# Patient Record
Sex: Male | Born: 1995 | Race: Black or African American | Hispanic: No | Marital: Single
Health system: Midwestern US, Community
[De-identification: ages and names within clinical notes are randomized; demographics above are authoritative.]

## PROBLEM LIST (undated history)

## (undated) DIAGNOSIS — K439 Ventral hernia without obstruction or gangrene: Secondary | ICD-10-CM

## (undated) DIAGNOSIS — J45909 Unspecified asthma, uncomplicated: Secondary | ICD-10-CM

---
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
## 2016-09-20 ENCOUNTER — Ambulatory Visit (HOSPITAL_COMMUNITY)
Admission: EM | Admit: 2016-09-20 | Discharge: 2016-09-20 | Disposition: A | Discharge status: Home / Self Care | Payer: Managed Care, Other (non HMO) | Attending: Emergency Medicine | Admitting: Emergency Medicine

## 2016-09-20 ENCOUNTER — Encounter (HOSPITAL_COMMUNITY): Payer: Self-pay | Admitting: Emergency Medicine

## 2016-09-20 DIAGNOSIS — IMO0001 Reserved for inherently not codable concepts without codable children: Secondary | ICD-10-CM

## 2016-09-20 DIAGNOSIS — K219 Gastro-esophageal reflux disease without esophagitis: Secondary | ICD-10-CM

## 2016-09-20 DIAGNOSIS — R1110 Vomiting, unspecified: Secondary | ICD-10-CM

## 2016-09-20 MED ORDER — RANITIDINE HCL 150 MG PO CAPS: ORAL_CAPSULE | ORAL | 0 refills | Status: DC

## 2016-09-20 MED ORDER — OMEPRAZOLE 20 MG PO CPDR: 20.0000 mg | DELAYED_RELEASE_CAPSULE | Freq: Every day | ORAL | 0 refills | Status: DC

## 2016-09-20 MED ORDER — METOCLOPRAMIDE HCL 10 MG PO TABS: 10.0000 mg | ORAL_TABLET | Freq: Three times a day (TID) | ORAL | 0 refills | Status: DC

## 2016-09-20 NOTE — ED Triage Notes (Signed)
Pt c/o GERD onset last night associated w/vomiting and spitting up  States sx increased last night after eating  Taking pepto bismol w/temp relief.   A&O x4... NAD

## 2016-09-20 NOTE — ED Provider Notes (Signed)
CSN: [id number]     Arrival date & time 09/20/16  1000 History   First [id number] Initiated Contact with Patient 09/20/16 1014     Chief Complaint  Patient presents with  . Gastroesophageal Reflux   (Consider location/radiation/quality/duration/timing/severity/associated sxs/prior Treatment) 20 year old male states that he has been diagnosed with GERD in the past stating symptoms occur approximately one time per year. Last night approximately 8 PM he developed regurgitation and vomiting without nausea. He has been having pain across the abdomen primarily on the right for the past several hours. He denies fever, chills or nausea. He states this is similar to past episodes in which he has been diagnosed with reflux. His medications prescribed to him in the past as Gaviscon and Pepto-Bismol.      Past Medical History:  Diagnosis Date  . Asthma    History reviewed. No pertinent surgical history. Family History  Problem Relation Age of Onset  . Hyperlipidemia Maternal Grandmother   . Cancer Maternal Grandfather   . Hypertension Paternal Grandmother   . Cancer Paternal Grandfather    Social History  Substance Use Topics  . Smoking status: Never Smoker  . Smokeless tobacco: Never Used  . Alcohol use No    Review of Systems  Constitutional: Positive for activity change and appetite change. Negative for fever.  HENT: Negative.   Respiratory: Negative.   Cardiovascular: Negative.   Gastrointestinal: Positive for abdominal pain and vomiting. Negative for blood in stool, constipation, diarrhea and nausea.  Genitourinary: Negative.   Musculoskeletal: Negative.   Neurological: Negative.   All other systems reviewed and are negative.   Allergies  Patient has no known allergies.  Home Medications   Prior to Admission medications   Medication Sig Start Date End Date Taking? Authorizing Provider  albuterol (PROVENTIL HFA;VENTOLIN HFA) 108 (90 BASE) MCG/ACT inhaler Inhale 2 puffs into the  lungs every 4 (four) hours as needed for wheezing or shortness of breath (cough, shortness of breath or wheezing.). 06/05/15   Emi Belfasteborah B Gessner, FNP  cetirizine (ZYRTEC) 10 MG tablet Take 10 mg by mouth daily.    Historical Provider, MD  fluticasone (FLONASE) 50 MCG/ACT nasal spray Place 2 sprays into both nostrils daily. 06/05/15   Emi Belfasteborah B Gessner, FNP  metoCLOPramide (REGLAN) 10 MG tablet Take 1 tablet (10 mg total) by mouth 4 (four) times daily -  before meals and at bedtime. 09/20/16   Hayden Rasmussenavid Amalea Ottey, NP  omeprazole (PRILOSEC) 20 MG capsule Take 1 capsule (20 mg total) by mouth daily. 09/20/16   Hayden Rasmussenavid Shadrick Senne, NP  ranitidine (ZANTAC) 150 MG capsule Take 1 capsule 2 hours before bedtime 09/20/16   Hayden Rasmussenavid Oliviarose Punch, NP   Meds Ordered and Administered this Visit  Medications - No data to display  BP 116/76 (BP Location: Left Arm)   Pulse 89   Temp 98.3 F (36.8 C) (Oral)   Resp 20   SpO2 96%  No data found.   Physical Exam  Constitutional: He is oriented to person, place, and time. He appears well-developed and well-nourished.  Neck: Neck supple.  Cardiovascular: Normal rate.   Abdominal: Soft. Bowel sounds are normal. He exhibits no distension.  Minor tenderness in the right hemiabdomen. No rebound, guarding or masses.  Musculoskeletal: Normal range of motion. He exhibits no edema.  Neurological: He is alert and oriented to person, place, and time.  Skin: Skin is warm and dry.  Psychiatric: He has a normal mood and affect.  Nursing note and vitals reviewed.   Urgent  Care Course   Clinical Course     Procedures (including critical care time)  Labs Review Labs Reviewed - No data to display  Imaging Review No results found.   Visual Acuity Review  Right Eye Distance:   Left Eye Distance:   Bilateral Distance:    Right Eye Near:   Left Eye Near:    Bilateral Near:         MDM   1. Gastroesophageal reflux disease, esophagitis presence not specified   2. Regurgitation     Follow-up with your primary care provider soon as possible. You may need referral to a gastroenterologist to have additional testing performed. If you develop increasing abdominal pain which does not get better or if you have persistent vomiting with the inability to drink fluids or eat seek medical attention promptly. Take your medicine patients as directed. For nail no greasy foods fast foods or large meals. Do not eat within 2 hours of going to bed. Meds ordered this encounter  Medications  . omeprazole (PRILOSEC) 20 MG capsule    Sig: Take 1 capsule (20 mg total) by mouth daily.    Dispense:  24 capsule    Refill:  0    Order Specific Question:   Supervising Provider    Answer:   Charm RingsHONIG, ERIN J [id number]]  . metoCLOPramide (REGLAN) 10 MG tablet    Sig: Take 1 tablet (10 mg total) by mouth 4 (four) times daily -  before meals and at bedtime.    Dispense:  30 tablet    Refill:  0    Order Specific Question:   Supervising Provider    Answer:   Charm RingsHONIG, ERIN J [id number]]  . ranitidine (ZANTAC) 150 MG capsule    Sig: Take 1 capsule 2 hours before bedtime    Dispense:  30 capsule    Refill:  0    Order Specific Question:   Supervising Provider    Answer:   Micheline ChapmanHONIG, ERIN J [[id number]]       Hayden Rasmussenavid Macdonald Rigor, NP 09/20/16 1041

## 2016-09-20 NOTE — Discharge Instructions (Signed)
Follow-up with your primary care provider soon as possible. You may need referral to a gastroenterologist to have additional testing performed. If you develop increasing abdominal pain which does not get better or if you have persistent vomiting with the inability to drink fluids or eat seek medical attention promptly. Take your medicine patients as directed. For nail no greasy foods fast foods or large meals. Do not eat within 2 hours of going to bed.

## 2017-01-13 ENCOUNTER — Encounter

## 2017-01-13 ENCOUNTER — Inpatient Hospital Stay: Payer: PRIVATE HEALTH INSURANCE

## 2017-01-13 MED ORDER — PHENYLEPHRINE 10 MG/ML INJECTION
10 mg/mL | INTRAMUSCULAR | Status: AC
Start: 2017-01-13 — End: ?

## 2017-01-13 MED ORDER — SUGAMMADEX 100 MG/ML INTRAVENOUS SOLUTION
100 mg/mL | INTRAVENOUS | Status: DC | PRN
Start: 2017-01-13 — End: 2017-01-13
  Administered 2017-01-13: 16:00:00 via INTRAVENOUS

## 2017-01-13 MED ORDER — FENTANYL CITRATE (PF) 50 MCG/ML IJ SOLN
50 mcg/mL | INTRAMUSCULAR | Status: AC
Start: 2017-01-13 — End: ?

## 2017-01-13 MED ORDER — BUPIVACAINE (PF) 0.5 % (5 MG/ML) IJ SOLN
0.5 % (5 mg/mL) | INTRAMUSCULAR | Status: AC
Start: 2017-01-13 — End: ?

## 2017-01-13 MED ORDER — DEXAMETHASONE SODIUM PHOSPHATE 4 MG/ML IJ SOLN
4 mg/mL | INTRAMUSCULAR | Status: AC
Start: 2017-01-13 — End: ?

## 2017-01-13 MED ORDER — LIDOCAINE HCL 1 % (10 MG/ML) IJ SOLN
10 mg/mL (1 %) | Freq: Once | INTRAMUSCULAR | Status: DC | PRN
Start: 2017-01-13 — End: 2017-01-13

## 2017-01-13 MED ORDER — MIDAZOLAM 1 MG/ML IJ SOLN
1 mg/mL | INTRAMUSCULAR | Status: DC | PRN
Start: 2017-01-13 — End: 2017-01-13
  Administered 2017-01-13: 15:00:00 via INTRAVENOUS

## 2017-01-13 MED ORDER — MIDAZOLAM 1 MG/ML IJ SOLN
1 mg/mL | INTRAMUSCULAR | Status: AC
Start: 2017-01-13 — End: ?

## 2017-01-13 MED ORDER — DEXAMETHASONE SODIUM PHOSPHATE 4 MG/ML IJ SOLN
4 mg/mL | INTRAMUSCULAR | Status: DC | PRN
Start: 2017-01-13 — End: 2017-01-13
  Administered 2017-01-13: 15:00:00 via INTRAVENOUS

## 2017-01-13 MED ORDER — GUM MASTIC-STORAX-METHYLSALICYLATE-ALCOHOL IN A DROPPERETTE
Status: AC
Start: 2017-01-13 — End: ?

## 2017-01-13 MED ORDER — ONDANSETRON (PF) 4 MG/2 ML INJECTION
4 mg/2 mL | Freq: Once | INTRAMUSCULAR | Status: DC | PRN
Start: 2017-01-13 — End: 2017-01-13

## 2017-01-13 MED ORDER — ROCURONIUM 10 MG/ML IV
10 mg/mL | INTRAVENOUS | Status: DC | PRN
Start: 2017-01-13 — End: 2017-01-13
  Administered 2017-01-13 (×2): via INTRAVENOUS

## 2017-01-13 MED ORDER — OXYCODONE-ACETAMINOPHEN 5 MG-325 MG TAB
5-325 mg | ORAL_TABLET | ORAL | 0 refills | Status: AC | PRN
Start: 2017-01-13 — End: ?

## 2017-01-13 MED ORDER — LACTATED RINGERS IV
INTRAVENOUS | Status: DC
Start: 2017-01-13 — End: 2017-01-13
  Administered 2017-01-13: 15:00:00 via INTRAVENOUS

## 2017-01-13 MED ORDER — CEFAZOLIN 1 GRAM SOLUTION FOR INJECTION
1 gram | INTRAMUSCULAR | Status: DC | PRN
Start: 2017-01-13 — End: 2017-01-13
  Administered 2017-01-13: 15:00:00 via INTRAVENOUS

## 2017-01-13 MED ORDER — SODIUM CHLORIDE 0.9 % IV
25 mg/mL | INTRAVENOUS | Status: DC | PRN
Start: 2017-01-13 — End: 2017-01-13

## 2017-01-13 MED ORDER — SODIUM CHLORIDE 0.9 % IJ SYRG
INTRAMUSCULAR | Status: DC | PRN
Start: 2017-01-13 — End: 2017-01-13

## 2017-01-13 MED ORDER — LIDOCAINE (PF) 10 MG/ML (1 %) IJ SOLN
10 mg/mL (1 %) | INTRAMUSCULAR | Status: DC | PRN
Start: 2017-01-13 — End: 2017-01-13
  Administered 2017-01-13: 15:00:00 via INTRAVENOUS

## 2017-01-13 MED ORDER — FENTANYL CITRATE (PF) 50 MCG/ML IJ SOLN
50 mcg/mL | INTRAMUSCULAR | Status: DC | PRN
Start: 2017-01-13 — End: 2017-01-13
  Administered 2017-01-13: 15:00:00 via INTRAVENOUS

## 2017-01-13 MED ORDER — GLYCOPYRROLATE 0.2 MG/ML IJ SOLN
0.2 mg/mL | INTRAMUSCULAR | Status: AC
Start: 2017-01-13 — End: ?

## 2017-01-13 MED ORDER — FAMOTIDINE (PF) 20 MG/2 ML IV
202 mg/2 mL | Freq: Once | INTRAVENOUS | Status: DC
Start: 2017-01-13 — End: 2017-01-13

## 2017-01-13 MED ORDER — CEFAZOLIN 1 GRAM SOLUTION FOR INJECTION
1 gram | INTRAMUSCULAR | Status: AC
Start: 2017-01-13 — End: ?

## 2017-01-13 MED ORDER — ROCURONIUM 10 MG/ML IV
10 mg/mL | INTRAVENOUS | Status: AC
Start: 2017-01-13 — End: ?

## 2017-01-13 MED ORDER — SUGAMMADEX 100 MG/ML INTRAVENOUS SOLUTION
100 mg/mL | INTRAVENOUS | Status: AC
Start: 2017-01-13 — End: ?

## 2017-01-13 MED ORDER — GLYCOPYRROLATE 0.2 MG/ML IJ SOLN
0.2 mg/mL | INTRAMUSCULAR | Status: DC | PRN
Start: 2017-01-13 — End: 2017-01-13
  Administered 2017-01-13: 15:00:00 via INTRAVENOUS

## 2017-01-13 MED ORDER — FENTANYL CITRATE (PF) 50 MCG/ML IJ SOLN
50 mcg/mL | INTRAMUSCULAR | Status: DC | PRN
Start: 2017-01-13 — End: 2017-01-13
  Administered 2017-01-13: 17:00:00 via INTRAVENOUS

## 2017-01-13 MED ORDER — ONDANSETRON (PF) 4 MG/2 ML INJECTION
4 mg/2 mL | Freq: Once | INTRAMUSCULAR | Status: DC
Start: 2017-01-13 — End: 2017-01-13

## 2017-01-13 MED ORDER — LIDOCAINE HCL 1 % (10 MG/ML) IJ SOLN
10 mg/mL (1 %) | INTRAMUSCULAR | Status: AC
Start: 2017-01-13 — End: ?

## 2017-01-13 MED ORDER — PROPOFOL 10 MG/ML IV EMUL
10 mg/mL | INTRAVENOUS | Status: DC | PRN
Start: 2017-01-13 — End: 2017-01-13
  Administered 2017-01-13 (×2): via INTRAVENOUS

## 2017-01-13 MED ORDER — PHENYLEPHRINE 10 MG/ML INJECTION
10 mg/mL | INTRAMUSCULAR | Status: DC | PRN
Start: 2017-01-13 — End: 2017-01-13
  Administered 2017-01-13: 15:00:00 via INTRAVENOUS

## 2017-01-13 MED ORDER — PROPOFOL 10 MG/ML IV EMUL
10 mg/mL | INTRAVENOUS | Status: AC
Start: 2017-01-13 — End: ?

## 2017-01-13 MED ORDER — HYDROMORPHONE (PF) 1 MG/ML IJ SOLN
1 mg/mL | INTRAMUSCULAR | Status: DC | PRN
Start: 2017-01-13 — End: 2017-01-13

## 2017-01-13 MED ORDER — BUPIVACAINE (PF) 0.5 % (5 MG/ML) IJ SOLN
0.5 % (5 mg/mL) | INTRAMUSCULAR | Status: DC | PRN
Start: 2017-01-13 — End: 2017-01-13
  Administered 2017-01-13: 16:00:00 via SUBCUTANEOUS

## 2017-01-13 MED FILL — BUPIVACAINE (PF) 0.5 % (5 MG/ML) IJ SOLN: 0.5 % (5 mg/mL) | INTRAMUSCULAR | Qty: 30

## 2017-01-13 MED FILL — GUM MASTIC-STORAX-METHYLSALICYLATE-ALCOHOL IN A DROPPERETTE: Qty: 1

## 2017-01-13 MED FILL — BRIDION 100 MG/ML INTRAVENOUS SOLUTION: 100 mg/mL | INTRAVENOUS | Qty: 4

## 2017-01-13 MED FILL — GLYCOPYRROLATE 0.2 MG/ML IJ SOLN: 0.2 mg/mL | INTRAMUSCULAR | Qty: 1

## 2017-01-13 MED FILL — MIDAZOLAM 1 MG/ML IJ SOLN: 1 mg/mL | INTRAMUSCULAR | Qty: 2

## 2017-01-13 MED FILL — FENTANYL CITRATE (PF) 50 MCG/ML IJ SOLN: 50 mcg/mL | INTRAMUSCULAR | Qty: 2

## 2017-01-13 MED FILL — CEFAZOLIN 1 GRAM SOLUTION FOR INJECTION: 1 gram | INTRAMUSCULAR | Qty: 2000

## 2017-01-13 MED FILL — DEXAMETHASONE SODIUM PHOSPHATE 4 MG/ML IJ SOLN: 4 mg/mL | INTRAMUSCULAR | Qty: 5

## 2017-01-13 MED FILL — DIPRIVAN 10 MG/ML INTRAVENOUS EMULSION: 10 mg/mL | INTRAVENOUS | Qty: 20

## 2017-01-13 MED FILL — LACTATED RINGERS IV: INTRAVENOUS | Qty: 1000

## 2017-01-13 MED FILL — PHENYLEPHRINE 10 MG/ML INJECTION: 10 mg/mL | INTRAMUSCULAR | Qty: 1

## 2017-01-13 MED FILL — LIDOCAINE HCL 1 % (10 MG/ML) IJ SOLN: 10 mg/mL (1 %) | INTRAMUSCULAR | Qty: 20

## 2017-01-13 MED FILL — ROCURONIUM 10 MG/ML IV: 10 mg/mL | INTRAVENOUS | Qty: 5

## 2017-01-13 NOTE — Op Note (Signed)
[facility]  [facility]  NAME:  Sondra ComeJONES, Jeron  SEX:   M  DATE: 01/13/2017  DOB: [birth year]  MR#    [id number]  ROOM:  PL  ACCT#  [id number]    cc: DAN ELLIOTT DO    PREOPERATIVE DIAGNOSIS:  Epigastric hernia.    POSTOPERATIVE DIAGNOSIS:  Epigastric hernia.    PROCEDURE:  Epigastric hernia repair.    SURGEON:  Waynetta Sandyavid Val Farnam, MD    ANESTHESIA:  General anesthesia was used.    ESTIMATED BLOOD LOSS:  1 mL.    INDICATION FOR PROCEDURE:  A 21 year old with an epigastric hernia causing him pain and discomfort.  The patient now for repair.    DESCRIPTION OF PROCEDURE:  The patient was taken to the operating room and identified.  The patient was placed in supine position.  The patient was placed under general anesthesia.  The patient was then prepped and draped in normal standard fashion.  At this time, incision was made just over the palpable mass in the epigastric region in the midline.  After the small incision was made, carefully dissection was continued downward.  There was preperitoneal fat coming through the fascia.  This was carefully dissected around and the preperitoneal fat was then cut and removed.  The fascial defect was approximately 4 mm to 5 mm.  Three interrupted 0 Ethibond sutures were then used to close the defect.  After this was done, irrigations were then used.  The wound was then closed with 3-0 Vicryl suture and 4-0 Monocryl suture.  Marcaine 0.5% was used to anesthetize the area.  The patient tolerated the procedure well.      ___________________  Philis PiqueAVID L Osbaldo Mark MD  Dictated By:.   SB  D:01/14/2017 07:39:17  T: 01/14/2017 07:55:42  [id number]

## 2017-01-13 NOTE — Anesthesia Post-Procedure Evaluation (Signed)
Post-Anesthesia Evaluation and Assessment    Patient: Alexander Barajas MRN: [id number]  SSN: [id number]    Date of Birth: 06/06/1996  Age: 21 y.o.  Sex: male       Cardiovascular Function/Vital Signs  Visit Vitals   ??? BP 115/53   ??? Pulse 65   ??? Temp 37 ??C (98.6 ??F)   ??? Resp 14   ??? Ht 5' 8.5" (1.74 m)   ??? Wt 70 kg (154 lb 5.2 oz)   ??? SpO2 100%   ??? BMI 23.12 kg/m2       Patient is status post general anesthesia for Procedure(s):  OPEN EPIGASTRIC HERNIA REPAIR.    Nausea/Vomiting: None    Postoperative hydration reviewed and adequate.    Pain:  Pain Scale 1: Numeric (0 - 10) (01/13/17 1329)  Pain Intensity 1: 1 (01/13/17 1329)   Managed    Neurological Status:   Neuro (WDL): Within Defined Limits (01/13/17 1313)  Neuro  Neurologic State: Sleeping;Eyes open to stimulus (01/13/17 1213)   At baseline    Mental Status and Level of Consciousness: Arousable    Pulmonary Status:   O2 Device: Room air (01/13/17 1339)   Adequate oxygenation and airway patent    Complications related to anesthesia: None    Post-anesthesia assessment completed. No concerns    Signed By: A. Dan EuropeMorgan Gearl Kimbrough, MD     January 13, 2017

## 2017-01-13 NOTE — Anesthesia Pre-Procedure Evaluation (Addendum)
Anesthetic History   No history of anesthetic complications            Review of Systems / Medical History  Patient summary reviewed, nursing notes reviewed and pertinent labs reviewed    Pulmonary            Asthma        Neuro/Psych   Within defined limits           Cardiovascular  Within defined limits                     GI/Hepatic/Renal     GERD: well controlled           Endo/Other  Within defined limits           Other Findings              Physical Exam    Airway  Mallampati: II  TM Distance: 4 - 6 cm  Neck ROM: normal range of motion   Mouth opening: Normal     Cardiovascular  Regular rate and rhythm,  S1 and S2 normal,  no murmur, click, rub, or gallop             Dental  No notable dental hx       Pulmonary                 Abdominal  GI exam deferred       Other Findings            Anesthetic Plan    ASA: 1  Anesthesia type: general          Induction: Intravenous  Anesthetic plan and risks discussed with: Patient      Discussed the anesthesiology plan and consent form including common and serious adverse events in detail.  QA to patient satisfaction.

## 2017-01-13 NOTE — Brief Op Note (Signed)
BRIEF OPERATIVE NOTE    Date of Procedure: 01/13/2017   Preoperative Diagnosis: Epigastric hernia [K43.9]  Postoperative Diagnosis: Epigastric hernia [K43.9]    Procedure(s):  OPEN EPIGASTRIC HERNIA REPAIR  Surgeon(s) and Role:     * Philis Piqueavid L Adeliz Tonkinson, MD - Primary         Assistant Staff: None      Surgical Staff:  Circ-1: Randel BooksJulie L Fromknecht, RN  Scrub Tech-1: Canary Brimhristopher J Scales; Ruthe MannanErika Harris  Surg Asst-1: Jolayne PantherKelsey E Doyle  Event Time In   Incision Start 1128   Incision Close 1139     Anesthesia: General   Estimated Blood Loss: 1  Specimens: * No specimens in log *   Findings: small epigastric hernia   Complications: none  Implants: * No implants in log *

## 2017-01-13 NOTE — Op Note (Signed)
[facility]  Operation Report  NAME:  Alexander Barajas, Alexander Barajas  SEX:   M  DATE: 01/13/2017  DOB: 04/08/1996  MR#    [id number]  ROOM:  PL  ACCT#  [id number]    cc: DAN ELLIOTT DO    PREOPERATIVE DIAGNOSIS:  Epigastric hernia.    POSTOPERATIVE DIAGNOSIS:  Epigastric hernia.    PROCEDURE:  Epigastric hernia repair.    SURGEON:  Nixon Sparr, MD    ANESTHESIA:  General anesthesia was used.    ESTIMATED BLOOD LOSS:  1 mL.    INDICATION FOR PROCEDURE:  A 21-year-old with an epigastric hernia causing him pain and discomfort.  The patient now for repair.    DESCRIPTION OF PROCEDURE:  The patient was taken to the operating room and identified.  The patient was placed in supine position.  The patient was placed under general anesthesia.  The patient was then prepped and draped in normal standard fashion.  At this time, incision was made just over the palpable mass in the epigastric region in the midline.  After the small incision was made, carefully dissection was continued downward.  There was preperitoneal fat coming through the fascia.  This was carefully dissected around and the preperitoneal fat was then cut and removed.  The fascial defect was approximately 4 mm to 5 mm.  Three interrupted 0 Ethibond sutures were then used to close the defect.  After this was done, irrigations were then used.  The wound was then closed with 3-0 Vicryl suture and 4-0 Monocryl suture.  Marcaine 0.5% was used to anesthetize the area.  The patient tolerated the procedure well.      ___________________  Kidada Ging L Jill Ruppe MD  Dictated By:.   SB  D:01/14/2017 07:39:17  T: 01/14/2017 07:55:42  [id number]

## 2017-01-13 NOTE — Other (Signed)
Patient's father given an update on his condition

## 2017-01-15 MED FILL — CEFAZOLIN 1 GRAM SOLUTION FOR INJECTION: 1 gram | INTRAMUSCULAR | Qty: 2

## 2017-01-15 MED FILL — GLYCOPYRROLATE 0.2 MG/ML IJ SOLN: 0.2 mg/mL | INTRAMUSCULAR | Qty: 0.2

## 2017-01-15 MED FILL — PROPOFOL 10 MG/ML IV EMUL: 10 mg/mL | INTRAVENOUS | Qty: 150

## 2017-01-15 MED FILL — ROCURONIUM 10 MG/ML IV: 10 mg/mL | INTRAVENOUS | Qty: 50

## 2017-01-15 MED FILL — PHENYLEPHRINE 10 MG/ML INJECTION: 10 mg/mL | INTRAMUSCULAR | Qty: 100

## 2017-01-15 MED FILL — BRIDION 100 MG/ML INTRAVENOUS SOLUTION: 100 mg/mL | INTRAVENOUS | Qty: 280

## 2017-01-15 MED FILL — XYLOCAINE-MPF 10 MG/ML (1 %) INJECTION SOLUTION: 10 mg/mL (1 %) | INTRAMUSCULAR | Qty: 50

## 2017-01-15 MED FILL — DEXAMETHASONE SODIUM PHOSPHATE 4 MG/ML IJ SOLN: 4 mg/mL | INTRAMUSCULAR | Qty: 10

## 2021-02-06 ENCOUNTER — Encounter (HOSPITAL_COMMUNITY): Payer: Self-pay

## 2021-02-06 DIAGNOSIS — J029 Acute pharyngitis, unspecified: Secondary | ICD-10-CM | POA: Diagnosis not present

## 2021-02-06 DIAGNOSIS — Z7951 Long term (current) use of inhaled steroids: Secondary | ICD-10-CM | POA: Insufficient documentation

## 2021-02-06 DIAGNOSIS — Z202 Contact with and (suspected) exposure to infections with a predominantly sexual mode of transmission: Secondary | ICD-10-CM | POA: Insufficient documentation

## 2021-02-06 DIAGNOSIS — J45909 Unspecified asthma, uncomplicated: Secondary | ICD-10-CM | POA: Insufficient documentation

## 2021-02-06 LAB — GROUP A STREP BY PCR: Group A Strep by PCR: NOT DETECTED

## 2021-02-06 MED ORDER — LIDOCAINE VISCOUS HCL 2 % MT SOLN
15.0000 mL | Freq: Once | OROMUCOSAL | Status: AC
  Administered 2021-02-07: 15 mL via OROMUCOSAL
  Filled 2021-02-06: qty 15

## 2021-02-06 NOTE — ED Triage Notes (Addendum)
Emergency Medicine Provider Triage Evaluation Note  Melvin Reese , a 25 y.o. male  was evaluated in triage.  Pt complains of worsening sore throat x 4 days. Pain with swallowing. Notes sores on tongue. No cough or CP. States he has been clearing his throat and is having specks of blood on his mucous.   Single sore on the penis. Started about 1 week ago. No dysuria, penile discharge, hematuria. Pt sexually active with 1 male in the past 6 months. Notes vaginal intercourse as well as oral intercourse. No condom use.   Pt seen at [facility] on 4/10. He states his RPR, HIV and strep test were negative. GC/chlamydia is pending. He states his penis and throat were swabbed. He has been taking ibuprofen, tylenol and swished viscous lidocaine today which provided short term relief. He presents today due to continued pain.   Physical Exam  BP 120/69   Pulse (!) 116   Temp 99.7 F (37.6 C) (Oral)   Resp 18   Ht 5\' 8"  (1.727 m)   Wt 72.6 kg   SpO2 97%   BMI 24.33 kg/m  Gen:   Awake, no distress   HEENT:  Atraumatic  Resp:  Normal effort  Cardiac:  Normal rate  Abd:   Nondistended, nontender  MSK:   Moves extremities without difficulty  Neuro:  Speech clear   Medical Decision Making  Medically screening exam initiated at 10:49 PM.  Appropriate orders placed.  was informed that the remainder of the evaluation will be completed by another provider, this initial triage assessment does not replace that evaluation, and the importance of remaining in the ED until their evaluation is complete.    Algis Liming, PA-C 02/06/21 2254    04/08/21, PA-C 02/06/21 2256

## 2021-02-06 NOTE — ED Triage Notes (Signed)
Pt c/o sore throat x 6 days and lesion to penis, seen at [facility] and still awaiting some of the results. States he had a fever on Saturday. Last dose motrin @ 1p

## 2021-02-07 ENCOUNTER — Emergency Department (HOSPITAL_COMMUNITY)
Admission: EM | Admit: 2021-02-07 | Discharge: 2021-02-07 | Disposition: A | Discharge status: Home / Self Care | Payer: 59 | Attending: Emergency Medicine | Admitting: Emergency Medicine

## 2021-02-07 DIAGNOSIS — J029 Acute pharyngitis, unspecified: Secondary | ICD-10-CM

## 2021-02-07 DIAGNOSIS — Z711 Person with feared health complaint in whom no diagnosis is made: Secondary | ICD-10-CM

## 2021-02-07 LAB — CBC WITH DIFFERENTIAL/PLATELET
Abs Immature Granulocytes: 0.01 10*3/uL (ref 0.00–0.07)
Basophils Absolute: 0 10*3/uL (ref 0.0–0.1)
Basophils Relative: 0 %
Eosinophils Absolute: 0 10*3/uL (ref 0.0–0.5)
Eosinophils Relative: 0 %
HCT: 37 % — ABNORMAL LOW (ref 39.0–52.0)
Hemoglobin: 13.1 g/dL (ref 13.0–17.0)
Immature Granulocytes: 0 %
Lymphocytes Relative: 35 %
Lymphs Abs: 1.6 10*3/uL (ref 0.7–4.0)
MCH: 32.7 pg (ref 26.0–34.0)
MCHC: 35.4 g/dL (ref 30.0–36.0)
MCV: 92.3 fL (ref 80.0–100.0)
Monocytes Absolute: 0.3 10*3/uL (ref 0.1–1.0)
Monocytes Relative: 7 %
Neutro Abs: 2.7 10*3/uL (ref 1.7–7.7)
Neutrophils Relative %: 58 %
Platelets: 213 10*3/uL (ref 150–400)
RBC: 4.01 MIL/uL — ABNORMAL LOW (ref 4.22–5.81)
RDW: 11.2 % — ABNORMAL LOW (ref 11.5–15.5)
WBC: 4.8 10*3/uL (ref 4.0–10.5)
nRBC: 0 % (ref 0.0–0.2)

## 2021-02-07 LAB — COMPREHENSIVE METABOLIC PANEL
ALT: 13 U/L (ref 0–44)
AST: 15 U/L (ref 15–41)
Albumin: 3.5 g/dL (ref 3.5–5.0)
Alkaline Phosphatase: 36 U/L — ABNORMAL LOW (ref 38–126)
Anion gap: 8 (ref 5–15)
BUN: 13 mg/dL (ref 6–20)
CO2: 25 mmol/L (ref 22–32)
Calcium: 8.4 mg/dL — ABNORMAL LOW (ref 8.9–10.3)
Chloride: 105 mmol/L (ref 98–111)
Creatinine, Ser: 1.32 mg/dL — ABNORMAL HIGH (ref 0.61–1.24)
GFR, Estimated: >60 mL/min (ref >60)
Glucose, Bld: 87 mg/dL (ref 70–99)
Potassium: 4.1 mmol/L (ref 3.5–5.1)
Sodium: 138 mmol/L (ref 135–145)
Total Bilirubin: 1.7 mg/dL — ABNORMAL HIGH (ref 0.3–1.2)
Total Protein: 6.5 g/dL (ref 6.5–8.1)

## 2021-02-07 LAB — MONONUCLEOSIS SCREEN: Mono Screen: NEGATIVE

## 2021-02-07 LAB — CK: Total CK: 91 U/L (ref 49–397)

## 2021-02-07 MED ORDER — SODIUM CHLORIDE 0.9 % IV BOLUS (SEPSIS)
1000.0000 mL | Freq: Once | INTRAVENOUS | Status: AC
  Administered 2021-02-07: 1000 mL via INTRAVENOUS

## 2021-02-07 MED ORDER — KETOROLAC TROMETHAMINE 30 MG/ML IJ SOLN
30.0000 mg | Freq: Once | INTRAMUSCULAR | Status: AC
  Administered 2021-02-07: 30 mg via INTRAVENOUS
  Filled 2021-02-07: qty 1

## 2021-02-07 MED ORDER — DOXYCYCLINE HYCLATE 100 MG PO CAPS: 100.0000 mg | ORAL_CAPSULE | Freq: Two times a day (BID) | ORAL | 0 refills | Status: AC

## 2021-02-07 MED ORDER — CEFTRIAXONE SODIUM 1 G IJ SOLR
500.0000 mg | Freq: Once | INTRAMUSCULAR | Status: AC
  Administered 2021-02-07: 500 mg via INTRAMUSCULAR
  Filled 2021-02-07: qty 10

## 2021-02-07 MED ORDER — STERILE WATER FOR INJECTION IJ SOLN
INTRAMUSCULAR | Status: AC
  Administered 2021-02-07: 2.1 mL
  Filled 2021-02-07: qty 10

## 2021-02-07 NOTE — ED Provider Notes (Signed)
[facility]-EMERGENCY DEPT Provider Note   CSN: [id number] Arrival date & time: 02/06/21  2038     History Chief Complaint  Patient presents with  . Sore Throat  . Exposure to STD    Melvin Reese is a 25 y.o. male.  The history is provided by the patient.  Sore Throat This is a new problem. The current episode started more than 2 days ago. The problem occurs daily. The problem has been gradually worsening. Pertinent negatives include no chest pain and no abdominal pain. The symptoms are aggravated by swallowing. Nothing relieves the symptoms.  Exposure to STD Pertinent negatives include no chest pain and no abdominal pain.  Patient with history of asthma presents with sore throat.  Patient reports he has had a sore throat that is worsening for the past 4 days.  He was seen in outside facility had extensive testing done and had a negative strep test.  He also reports recent penile lesion and concern for STD.  This has already been addressed at the outside facility and has had STD testing.  Patient reports continued pain with swallowing.  No fevers or vomiting.  He reports intermittent muscle aches and has a history of rhabdomyolysis.  No joint swelling.    He is otherwise healthy.  No other new medications No travel, no tick bites Past Medical History:  Diagnosis Date  . Asthma     Patient Active Problem List   Diagnosis Date Noted  . Asthma, chronic 06/05/2015    History reviewed. No pertinent surgical history.     Family History  Problem Relation Age of Onset  . Hyperlipidemia Maternal Grandmother   . Cancer Maternal Grandfather   . Hypertension Paternal Grandmother   . Cancer Paternal Grandfather     Social History   Tobacco Use  . Smoking status: Never Smoker  . Smokeless tobacco: Never Used  Substance Use Topics  . Alcohol use: No    Alcohol/week: 0.0 standard drinks  . Drug use: No    Home Medications Prior to Admission  medications   Medication Sig Start Date End Date Taking? Authorizing Provider  albuterol (PROVENTIL HFA;VENTOLIN HFA) 108 (90 BASE) MCG/ACT inhaler Inhale 2 puffs into the lungs every 4 (four) hours as needed for wheezing or shortness of breath (cough, shortness of breath or wheezing.). 06/05/15   Emi Belfast, FNP  cetirizine (ZYRTEC) 10 MG tablet Take 10 mg by mouth daily.    [provider]  fluticasone (FLONASE) 50 MCG/ACT nasal spray Place 2 sprays into both nostrils daily. 06/05/15   Emi Belfast, FNP  metoCLOPramide (REGLAN) 10 MG tablet Take 1 tablet (10 mg total) by mouth 4 (four) times daily -  before meals and at bedtime. 09/20/16   Hayden Rasmussen, NP  omeprazole (PRILOSEC) 20 MG capsule Take 1 capsule (20 mg total) by mouth daily. 09/20/16   Hayden Rasmussen, NP  ranitidine (ZANTAC) 150 MG capsule Take 1 capsule 2 hours before bedtime 09/20/16   Hayden Rasmussen, NP    Allergies    Patient has no known allergies.  Review of Systems   Review of Systems  Constitutional: Positive for fatigue. Negative for fever.  HENT: Positive for sore throat.   Respiratory: Negative for cough.   Cardiovascular: Negative for chest pain.  Gastrointestinal: Negative for abdominal pain.  Genitourinary: Positive for genital sores.  Skin: Negative for rash.       Denies rash to extremities  All other systems reviewed and  are negative.   Physical Exam Updated Vital Signs BP (!) 101/59   Pulse (!) 107   Temp 99.7 F (37.6 C) (Oral)   Resp 20   Ht 1.727 m (5\' 8" )   Wt 72.6 kg   SpO2 100%   BMI 24.33 kg/m  Repeat oral temperature 100.1 Physical Exam  CONSTITUTIONAL: Well developed/well nourished HEAD: Normocephalic/atraumatic EYES: EOMI/PERRL ENMT: Mucous membranes moist, uvula midline with erythema, scattered ulcers throughout his mouth.  No active bleeding.  No stridor or drooling.  No dysphonia NECK: supple no meningeal signs SPINE/BACK:entire spine nontender CV: S1/S2 noted, no  murmurs/rubs/gallops noted LUNGS: Lungs are clear to auscultation bilaterally, no apparent distress ABDOMEN: soft, nontender, no rebound or guarding, bowel sounds noted throughout abdomen GU:no cva tenderness, genital exam deferred as he just had this at an outside facility NEURO: Pt is awake/alert/appropriate, moves all extremitiesx4.  No facial droop.   EXTREMITIES: pulses normal/equal, full ROM SKIN: warm, color normal PSYCH: no abnormalities of mood noted, alert and oriented to situation  ED Results / Procedures / Treatments   Labs (all labs ordered are listed, but only abnormal results are displayed) Labs Reviewed  COMPREHENSIVE METABOLIC PANEL - Abnormal; Notable for the following components:      Result Value   Creatinine, Ser 1.32 (*)    Calcium 8.4 (*)    Alkaline Phosphatase 36 (*)    Total Bilirubin 1.7 (*)    All other components within normal limits  CBC WITH DIFFERENTIAL/PLATELET - Abnormal; Notable for the following components:   RBC 4.01 (*)    HCT 37.0 (*)    RDW 11.2 (*)    All other components within normal limits  GROUP A STREP BY PCR  CK  MONONUCLEOSIS SCREEN    EKG None  Radiology No results found.  Procedures Procedures   Medications Ordered in ED Medications  cefTRIAXone (ROCEPHIN) injection 500 mg (has no administration in time range)  lidocaine (XYLOCAINE) 2 % viscous mouth solution 15 mL (15 mLs Mouth/Throat Given 02/07/21 0123)  sodium chloride 0.9 % bolus 1,000 mL (0 mLs Intravenous Stopped 02/07/21 0420)  ketorolac (TORADOL) 30 MG/ML injection 30 mg (30 mg Intravenous Given 02/07/21 0247)    ED Course  I have reviewed the triage vital signs and the nursing notes.  Pertinent labs & imaging results that were available during my care of the patient were reviewed by me and considered in my medical decision making (see chart for details).    MDM Rules/Calculators/A&P                          2:36 AM Patient reports continued sore throat  and oral ulcers.  He reports concern for oral chlamydia as he has performed oral sex.  He reports his girlfriend was treated for chlamydia.  He initially denied Chlamydia testing from his throat, but he now reports to nursing that this was already checked at the outside facility.  Given this is a repeat visit and he has worsening symptoms, will check labs and reassess.  Patient is in no acute distress at this time 5:17 AM BP (!) 113/99   Pulse 95   Temp 99.7 F (37.6 C) (Oral)   Resp 15   Ht 1.727 m (5\' 8" )   Wt 72.6 kg   SpO2 100%   BMI 24.33 kg/m  Patient is improved.  He is in no distress.  Labs overall reassuring Suspect viral illness.  Patient is requesting  treatment for STDs.  Rocephin and doxycycline ordered Final Clinical Impression(s) / ED Diagnoses Final diagnoses:  Pharyngitis, unspecified etiology  Concern about STD in male without diagnosis    Rx / DC Orders ED Discharge Orders         Ordered    doxycycline (VIBRAMYCIN) 100 MG capsule  2 times daily        02/07/21 0510           Zadie Rhine, MD 02/07/21 [id number]
